# Patient Record
Sex: Female | Born: 1965 | Race: Black or African American | Hispanic: No | Marital: Single | Smoking: Never smoker
Health system: Southern US, Community
[De-identification: ages and names within clinical notes are randomized; demographics above are authoritative.]

## PROBLEM LIST (undated history)

## (undated) DIAGNOSIS — E7800 Pure hypercholesterolemia, unspecified: Secondary | ICD-10-CM

## (undated) HISTORY — PX: KNEE SURGERY: SHX244

---

## 2002-08-18 ENCOUNTER — Inpatient Hospital Stay (HOSPITAL_COMMUNITY): Admission: AC | Admit: 2002-08-18 | Discharge: 2002-09-02 | Payer: Self-pay

## 2002-08-18 ENCOUNTER — Encounter: Payer: Self-pay | Admitting: Orthopedic Surgery

## 2002-08-18 ENCOUNTER — Encounter: Payer: Self-pay | Admitting: Emergency Medicine

## 2002-08-30 ENCOUNTER — Encounter: Payer: Self-pay | Admitting: Orthopedic Surgery

## 2003-03-02 ENCOUNTER — Inpatient Hospital Stay (HOSPITAL_COMMUNITY): Admission: RE | Admit: 2003-03-02 | Discharge: 2003-03-04 | Payer: Self-pay | Admitting: Orthopedic Surgery

## 2003-03-02 ENCOUNTER — Encounter: Payer: Self-pay | Admitting: Orthopedic Surgery

## 2004-06-26 ENCOUNTER — Emergency Department (HOSPITAL_COMMUNITY): Admission: EM | Admit: 2004-06-26 | Discharge: 2004-06-26 | Payer: Self-pay | Admitting: Emergency Medicine

## 2011-04-27 ENCOUNTER — Other Ambulatory Visit (HOSPITAL_COMMUNITY)
Admission: RE | Admit: 2011-04-27 | Discharge: 2011-04-27 | Disposition: A | Discharge status: Home / Self Care | Payer: BC Managed Care – PPO | Source: Ambulatory Visit | Attending: Family Medicine | Admitting: Family Medicine

## 2011-04-27 DIAGNOSIS — Z0000 Encounter for general adult medical examination without abnormal findings: Secondary | ICD-10-CM | POA: Insufficient documentation

## 2011-04-27 DIAGNOSIS — Z113 Encounter for screening for infections with a predominantly sexual mode of transmission: Secondary | ICD-10-CM | POA: Insufficient documentation

## 2012-07-01 ENCOUNTER — Other Ambulatory Visit: Payer: Self-pay | Admitting: Physician Assistant

## 2012-07-01 DIAGNOSIS — Z1231 Encounter for screening mammogram for malignant neoplasm of breast: Secondary | ICD-10-CM

## 2012-07-15 ENCOUNTER — Other Ambulatory Visit (HOSPITAL_COMMUNITY)
Admission: RE | Admit: 2012-07-15 | Discharge: 2012-07-15 | Disposition: A | Discharge status: Home / Self Care | Payer: BC Managed Care – PPO | Source: Ambulatory Visit | Attending: Obstetrics and Gynecology | Admitting: Obstetrics and Gynecology

## 2012-07-15 DIAGNOSIS — Z1151 Encounter for screening for human papillomavirus (HPV): Secondary | ICD-10-CM | POA: Insufficient documentation

## 2012-07-15 DIAGNOSIS — Z01419 Encounter for gynecological examination (general) (routine) without abnormal findings: Secondary | ICD-10-CM | POA: Insufficient documentation

## 2012-07-16 ENCOUNTER — Ambulatory Visit
Admission: RE | Admit: 2012-07-16 | Discharge: 2012-07-16 | Disposition: A | Discharge status: Home / Self Care | Payer: BC Managed Care – PPO | Source: Ambulatory Visit | Attending: Physician Assistant | Admitting: Physician Assistant

## 2012-07-16 DIAGNOSIS — Z1231 Encounter for screening mammogram for malignant neoplasm of breast: Secondary | ICD-10-CM

## 2013-02-16 ENCOUNTER — Emergency Department (HOSPITAL_COMMUNITY)
Admission: EM | Admit: 2013-02-16 | Discharge: 2013-02-16 | Disposition: A | Discharge status: Home / Self Care | Payer: BC Managed Care – PPO | Attending: Emergency Medicine | Admitting: Emergency Medicine

## 2013-02-16 ENCOUNTER — Emergency Department (HOSPITAL_COMMUNITY): Payer: BC Managed Care – PPO

## 2013-02-16 ENCOUNTER — Encounter (HOSPITAL_COMMUNITY): Payer: Self-pay | Admitting: *Deleted

## 2013-02-16 DIAGNOSIS — IMO0002 Reserved for concepts with insufficient information to code with codable children: Secondary | ICD-10-CM | POA: Insufficient documentation

## 2013-02-16 DIAGNOSIS — S29019A Strain of muscle and tendon of unspecified wall of thorax, initial encounter: Secondary | ICD-10-CM

## 2013-02-16 DIAGNOSIS — S239XXA Sprain of unspecified parts of thorax, initial encounter: Secondary | ICD-10-CM | POA: Insufficient documentation

## 2013-02-16 DIAGNOSIS — Z79899 Other long term (current) drug therapy: Secondary | ICD-10-CM | POA: Insufficient documentation

## 2013-02-16 DIAGNOSIS — Y9389 Activity, other specified: Secondary | ICD-10-CM | POA: Insufficient documentation

## 2013-02-16 DIAGNOSIS — E7800 Pure hypercholesterolemia, unspecified: Secondary | ICD-10-CM | POA: Insufficient documentation

## 2013-02-16 DIAGNOSIS — S139XXA Sprain of joints and ligaments of unspecified parts of neck, initial encounter: Secondary | ICD-10-CM | POA: Insufficient documentation

## 2013-02-16 DIAGNOSIS — S161XXA Strain of muscle, fascia and tendon at neck level, initial encounter: Secondary | ICD-10-CM

## 2013-02-16 DIAGNOSIS — Y92410 Unspecified street and highway as the place of occurrence of the external cause: Secondary | ICD-10-CM | POA: Insufficient documentation

## 2013-02-16 HISTORY — DX: Pure hypercholesterolemia, unspecified: E78.00

## 2013-02-16 MED ORDER — CYCLOBENZAPRINE HCL 5 MG PO TABS: 5.0000 mg | ORAL_TABLET | Freq: Three times a day (TID) | ORAL | Status: DC | PRN

## 2013-02-16 MED ORDER — NAPROXEN 500 MG PO TABS: 500.0000 mg | ORAL_TABLET | Freq: Two times a day (BID) | ORAL | Status: DC
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
## 2013-04-05 ENCOUNTER — Encounter (HOSPITAL_COMMUNITY): Payer: Self-pay | Admitting: Emergency Medicine

## 2013-04-05 ENCOUNTER — Emergency Department (HOSPITAL_COMMUNITY)
Admission: EM | Admit: 2013-04-05 | Discharge: 2013-04-06 | Disposition: A | Discharge status: Home / Self Care | Payer: BC Managed Care – PPO | Attending: Emergency Medicine | Admitting: Emergency Medicine

## 2013-04-05 DIAGNOSIS — S0003XA Contusion of scalp, initial encounter: Secondary | ICD-10-CM | POA: Insufficient documentation

## 2013-04-05 DIAGNOSIS — S0093XA Contusion of unspecified part of head, initial encounter: Secondary | ICD-10-CM

## 2013-04-05 DIAGNOSIS — S20411A Abrasion of right back wall of thorax, initial encounter: Secondary | ICD-10-CM

## 2013-04-05 DIAGNOSIS — Y9389 Activity, other specified: Secondary | ICD-10-CM | POA: Insufficient documentation

## 2013-04-05 DIAGNOSIS — S0083XA Contusion of other part of head, initial encounter: Secondary | ICD-10-CM | POA: Insufficient documentation

## 2013-04-05 DIAGNOSIS — W1809XA Striking against other object with subsequent fall, initial encounter: Secondary | ICD-10-CM | POA: Insufficient documentation

## 2013-04-05 DIAGNOSIS — Y92410 Unspecified street and highway as the place of occurrence of the external cause: Secondary | ICD-10-CM | POA: Insufficient documentation

## 2013-04-05 DIAGNOSIS — S40811A Abrasion of right upper arm, initial encounter: Secondary | ICD-10-CM

## 2013-04-05 DIAGNOSIS — W010XXA Fall on same level from slipping, tripping and stumbling without subsequent striking against object, initial encounter: Secondary | ICD-10-CM | POA: Insufficient documentation

## 2013-04-05 DIAGNOSIS — E7800 Pure hypercholesterolemia, unspecified: Secondary | ICD-10-CM | POA: Insufficient documentation

## 2013-04-05 DIAGNOSIS — IMO0002 Reserved for concepts with insufficient information to code with codable children: Secondary | ICD-10-CM | POA: Insufficient documentation

## 2013-04-05 NOTE — ED Notes (Signed)
PT. REPORTS TRIPPED AND FELL WHILE PLAYING AROUND WITH KIDS TODAY , NO LOC /AMBULATORY , REPORTS SLIGHT HEADACHE .
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
## 2013-04-06 NOTE — ED Notes (Signed)
Pt refused to allow RN to take BP at discharge.

## 2013-04-06 NOTE — ED Notes (Signed)
Pupils reactive and equal.

## 2013-08-27 ENCOUNTER — Other Ambulatory Visit: Payer: Self-pay

## 2013-08-27 DIAGNOSIS — Z1231 Encounter for screening mammogram for malignant neoplasm of breast: Secondary | ICD-10-CM

## 2013-09-03 ENCOUNTER — Ambulatory Visit
Admission: RE | Admit: 2013-09-03 | Discharge: 2013-09-03 | Disposition: A | Discharge status: Home / Self Care | Payer: BC Managed Care – PPO | Source: Ambulatory Visit

## 2013-09-03 DIAGNOSIS — Z1231 Encounter for screening mammogram for malignant neoplasm of breast: Secondary | ICD-10-CM

## 2014-08-03 ENCOUNTER — Other Ambulatory Visit: Payer: Self-pay

## 2014-08-03 DIAGNOSIS — Z1231 Encounter for screening mammogram for malignant neoplasm of breast: Secondary | ICD-10-CM

## 2014-09-06 ENCOUNTER — Other Ambulatory Visit: Payer: Self-pay | Admitting: Family Medicine

## 2014-09-06 ENCOUNTER — Other Ambulatory Visit (HOSPITAL_COMMUNITY)
Admission: RE | Admit: 2014-09-06 | Discharge: 2014-09-06 | Disposition: A | Discharge status: Home / Self Care | Payer: BC Managed Care – PPO | Source: Ambulatory Visit | Attending: Family Medicine | Admitting: Family Medicine

## 2014-09-06 DIAGNOSIS — Z01419 Encounter for gynecological examination (general) (routine) without abnormal findings: Secondary | ICD-10-CM | POA: Diagnosis not present

## 2014-09-07 LAB — CYTOLOGY - PAP

## 2014-09-16 ENCOUNTER — Ambulatory Visit
Admission: RE | Admit: 2014-09-16 | Discharge: 2014-09-16 | Disposition: A | Discharge status: Home / Self Care | Payer: BC Managed Care – PPO | Source: Ambulatory Visit

## 2014-09-16 DIAGNOSIS — Z1231 Encounter for screening mammogram for malignant neoplasm of breast: Secondary | ICD-10-CM

## 2014-09-17 ENCOUNTER — Other Ambulatory Visit: Payer: Self-pay | Admitting: Family Medicine

## 2014-09-17 DIAGNOSIS — R928 Other abnormal and inconclusive findings on diagnostic imaging of breast: Secondary | ICD-10-CM

## 2014-09-29 ENCOUNTER — Ambulatory Visit
Admission: RE | Admit: 2014-09-29 | Discharge: 2014-09-29 | Disposition: A | Discharge status: Home / Self Care | Payer: BC Managed Care – PPO | Source: Ambulatory Visit | Attending: Family Medicine | Admitting: Family Medicine

## 2014-09-29 DIAGNOSIS — R928 Other abnormal and inconclusive findings on diagnostic imaging of breast: Secondary | ICD-10-CM

## 2015-09-08 ENCOUNTER — Other Ambulatory Visit: Payer: Self-pay

## 2015-09-08 DIAGNOSIS — Z1231 Encounter for screening mammogram for malignant neoplasm of breast: Secondary | ICD-10-CM

## 2015-10-03 ENCOUNTER — Ambulatory Visit
Admission: RE | Admit: 2015-10-03 | Discharge: 2015-10-03 | Disposition: A | Discharge status: Home / Self Care | Payer: BLUE CROSS/BLUE SHIELD | Source: Ambulatory Visit

## 2015-10-03 DIAGNOSIS — Z1231 Encounter for screening mammogram for malignant neoplasm of breast: Secondary | ICD-10-CM

## 2016-04-30 DIAGNOSIS — M25562 Pain in left knee: Secondary | ICD-10-CM | POA: Diagnosis not present

## 2016-09-19 ENCOUNTER — Other Ambulatory Visit: Payer: Self-pay | Admitting: Family Medicine

## 2016-09-19 DIAGNOSIS — Z1231 Encounter for screening mammogram for malignant neoplasm of breast: Secondary | ICD-10-CM

## 2016-10-04 ENCOUNTER — Ambulatory Visit: Payer: BLUE CROSS/BLUE SHIELD

## 2016-10-08 ENCOUNTER — Ambulatory Visit
Admission: RE | Admit: 2016-10-08 | Discharge: 2016-10-08 | Disposition: A | Discharge status: Home / Self Care | Payer: BLUE CROSS/BLUE SHIELD | Source: Ambulatory Visit | Attending: Family Medicine | Admitting: Family Medicine

## 2016-10-08 DIAGNOSIS — Z1231 Encounter for screening mammogram for malignant neoplasm of breast: Secondary | ICD-10-CM

## 2017-02-08 ENCOUNTER — Other Ambulatory Visit: Payer: Self-pay | Admitting: Family Medicine

## 2017-02-08 ENCOUNTER — Other Ambulatory Visit (HOSPITAL_COMMUNITY)
Admission: RE | Admit: 2017-02-08 | Discharge: 2017-02-08 | Disposition: A | Discharge status: Home / Self Care | Payer: BLUE CROSS/BLUE SHIELD | Source: Ambulatory Visit | Attending: Family Medicine | Admitting: Family Medicine

## 2017-02-08 DIAGNOSIS — Z0000 Encounter for general adult medical examination without abnormal findings: Secondary | ICD-10-CM | POA: Diagnosis not present

## 2017-02-08 DIAGNOSIS — Z1151 Encounter for screening for human papillomavirus (HPV): Secondary | ICD-10-CM | POA: Insufficient documentation

## 2017-02-08 DIAGNOSIS — E782 Mixed hyperlipidemia: Secondary | ICD-10-CM | POA: Diagnosis not present

## 2017-02-08 DIAGNOSIS — Z124 Encounter for screening for malignant neoplasm of cervix: Secondary | ICD-10-CM | POA: Diagnosis not present

## 2017-02-11 LAB — CYTOLOGY - PAP
DIAGNOSIS: NEGATIVE
HPV (WINDOPATH): NOT DETECTED

## 2017-03-22 DIAGNOSIS — E782 Mixed hyperlipidemia: Secondary | ICD-10-CM | POA: Diagnosis not present

## 2017-06-27 DIAGNOSIS — M79604 Pain in right leg: Secondary | ICD-10-CM | POA: Diagnosis not present

## 2017-06-27 DIAGNOSIS — S7011XA Contusion of right thigh, initial encounter: Secondary | ICD-10-CM | POA: Diagnosis not present

## 2017-06-27 DIAGNOSIS — M79651 Pain in right thigh: Secondary | ICD-10-CM | POA: Diagnosis not present

## 2017-07-22 DIAGNOSIS — M79651 Pain in right thigh: Secondary | ICD-10-CM | POA: Diagnosis not present

## 2017-10-29 ENCOUNTER — Other Ambulatory Visit: Payer: Self-pay | Admitting: Family Medicine

## 2017-10-29 DIAGNOSIS — Z1231 Encounter for screening mammogram for malignant neoplasm of breast: Secondary | ICD-10-CM

## 2017-11-21 ENCOUNTER — Ambulatory Visit
Admission: RE | Admit: 2017-11-21 | Discharge: 2017-11-21 | Disposition: A | Discharge status: Home / Self Care | Payer: BLUE CROSS/BLUE SHIELD | Source: Ambulatory Visit | Attending: Family Medicine | Admitting: Family Medicine

## 2017-11-21 DIAGNOSIS — Z1231 Encounter for screening mammogram for malignant neoplasm of breast: Secondary | ICD-10-CM | POA: Diagnosis not present

## 2017-11-26 ENCOUNTER — Ambulatory Visit: Payer: BLUE CROSS/BLUE SHIELD

## 2018-06-29 ENCOUNTER — Other Ambulatory Visit: Payer: Self-pay

## 2018-06-29 ENCOUNTER — Emergency Department (HOSPITAL_COMMUNITY)
Admission: EM | Admit: 2018-06-29 | Discharge: 2018-06-29 | Disposition: A | Discharge status: Home / Self Care | Payer: BLUE CROSS/BLUE SHIELD | Attending: Emergency Medicine | Admitting: Emergency Medicine

## 2018-06-29 ENCOUNTER — Encounter (HOSPITAL_COMMUNITY): Payer: Self-pay | Admitting: Emergency Medicine

## 2018-06-29 DIAGNOSIS — M5441 Lumbago with sciatica, right side: Secondary | ICD-10-CM | POA: Diagnosis not present

## 2018-06-29 DIAGNOSIS — M5431 Sciatica, right side: Secondary | ICD-10-CM | POA: Diagnosis not present

## 2018-06-29 DIAGNOSIS — M545 Low back pain: Secondary | ICD-10-CM | POA: Diagnosis not present

## 2018-06-29 MED ORDER — OXYCODONE-ACETAMINOPHEN 5-325 MG PO TABS: 1.0000 | ORAL_TABLET | ORAL | 0 refills | Status: AC | PRN

## 2018-06-29 MED ORDER — OXYCODONE-ACETAMINOPHEN 5-325 MG PO TABS
2.0000 | ORAL_TABLET | Freq: Once | ORAL | Status: AC
  Administered 2018-06-29: 2 via ORAL
  Filled 2018-06-29: qty 2

## 2018-06-29 MED ORDER — METHOCARBAMOL 750 MG PO TABS: 750.0000 mg | ORAL_TABLET | Freq: Four times a day (QID) | ORAL | 0 refills | Status: AC

## 2018-06-29 MED ORDER — DIAZEPAM 5 MG PO TABS
5.0000 mg | ORAL_TABLET | Freq: Once | ORAL | Status: AC
  Administered 2018-06-29: 5 mg via ORAL
  Filled 2018-06-29: qty 1

## 2018-06-29 MED ORDER — KETOROLAC TROMETHAMINE 60 MG/2ML IM SOLN
60.0000 mg | Freq: Once | INTRAMUSCULAR | Status: AC
  Administered 2018-06-29: 60 mg via INTRAMUSCULAR
  Filled 2018-06-29: qty 2

## 2018-06-29 NOTE — ED Provider Notes (Signed)
[facility]   CSN: [id number] Arrival date & time: 06/29/18  0746     History   Chief Complaint Chief Complaint  Patient presents with  . Sciatica    HPI Karen Jefferson is a 52 y.o. female.  52 year old female presents with acute onset of right lower back pain x24 hours.  Pain is sharp and worse with any movement and does radiate down to her right mid thigh.  No bowel or bladder dysfunction.  Better with remaining still.  History of similar symptoms in the past but no diagnosis was made.  Denies any rashes.  No urinary symptoms.  No treatment used prior to arrival.     Past Medical History:  Diagnosis Date  . Hypercholesterolemia     There are no active problems to display for this patient.   Past Surgical History:  Procedure Laterality Date  . KNEE SURGERY       OB History   None      Home Medications    Prior to Admission medications   Medication Sig Start Date End Date Taking? Authorizing Provider  ibuprofen (ADVIL,MOTRIN) 200 MG tablet Take 200 mg by mouth every 6 (six) hours as needed for pain.    [provider]    Family History No family history on file.  Social History Social History   Tobacco Use  . Smoking status: Never Smoker  Substance Use Topics  . Alcohol use: Yes  . Drug use: No     Allergies   Patient has no known allergies.   Review of Systems Review of Systems  All other systems reviewed and are negative.    Physical Exam Updated Vital Signs BP (!) 147/85 (BP Location: Right Arm)   Pulse 62   Temp 98.8 F (37.1 C) (Oral)   Resp 16   SpO2 100%   Physical Exam  Constitutional: She is oriented to person, place, and time. She appears well-developed and well-nourished.  Non-toxic appearance. No distress.  HENT:  Head: Normocephalic and atraumatic.  Eyes: Pupils are equal, round, and reactive to light. Conjunctivae, EOM and lids are normal.  Neck: Normal  range of motion. Neck supple. No tracheal deviation present. No thyroid mass present.  Cardiovascular: Normal rate, regular rhythm and normal heart sounds. Exam reveals no gallop.  No murmur heard. Pulmonary/Chest: Effort normal and breath sounds normal. No stridor. No respiratory distress. She has no decreased breath sounds. She has no wheezes. She has no rhonchi. She has no rales.  Abdominal: Soft. Normal appearance and bowel sounds are normal. She exhibits no distension. There is no tenderness. There is no rebound and no CVA tenderness.  Musculoskeletal: Normal range of motion. She exhibits no edema or tenderness.       Back:  Neurological: She is alert and oriented to person, place, and time. She has normal strength. No cranial nerve deficit or sensory deficit. GCS eye subscore is 4. GCS verbal subscore is 5. GCS motor subscore is 6.  Bilateral lower extremity strength 5/5.  Skin: Skin is warm and dry. No abrasion and no rash noted.  Psychiatric: She has a normal mood and affect. Her speech is normal and behavior is normal.  Nursing note and vitals reviewed.    ED Treatments / Results  Labs (all labs ordered are listed, but only abnormal results are displayed) Labs Reviewed - No data to display  EKG None  Radiology No results found.  Procedures Procedures (including critical  care time)  Medications Ordered in ED Medications  ketorolac (TORADOL) injection 60 mg (has no administration in time range)     Initial Impression / Assessment and Plan / ED Course  I have reviewed the triage vital signs and the nursing notes.  Pertinent labs & imaging results that were available during my care of the patient were reviewed by me and considered in my medical decision making (see chart for details).     Patient medicated for pain here and feels better.  Suspect sciatica.  No focal neurological deficits.  Stable for discharge  Final Clinical Impressions(s) / ED Diagnoses   Final  diagnoses:  None    ED Discharge Orders    None       Lorre Nick, MD 06/29/18 1039

## 2018-06-29 NOTE — ED Triage Notes (Signed)
Pt reports pain in the right side of her back that radiates down her leg, states she bent over yesterday and felt pain afterwards, reports walking increases pain.

## 2018-06-29 NOTE — ED Notes (Signed)
Patient states ride is here and she needs discharge papers. MD notified.

## 2018-07-04 DIAGNOSIS — M5431 Sciatica, right side: Secondary | ICD-10-CM | POA: Diagnosis not present

## 2018-07-09 ENCOUNTER — Ambulatory Visit
Admission: RE | Admit: 2018-07-09 | Discharge: 2018-07-09 | Disposition: A | Discharge status: Home / Self Care | Payer: BLUE CROSS/BLUE SHIELD | Source: Ambulatory Visit | Attending: Family Medicine | Admitting: Family Medicine

## 2018-07-09 ENCOUNTER — Other Ambulatory Visit: Payer: Self-pay | Admitting: Family Medicine

## 2018-07-09 DIAGNOSIS — M5431 Sciatica, right side: Secondary | ICD-10-CM

## 2018-07-09 DIAGNOSIS — M47816 Spondylosis without myelopathy or radiculopathy, lumbar region: Secondary | ICD-10-CM | POA: Diagnosis not present

## 2018-07-09 DIAGNOSIS — M48061 Spinal stenosis, lumbar region without neurogenic claudication: Secondary | ICD-10-CM | POA: Diagnosis not present

## 2018-08-22 DIAGNOSIS — J309 Allergic rhinitis, unspecified: Secondary | ICD-10-CM | POA: Diagnosis not present

## 2018-08-22 DIAGNOSIS — Z23 Encounter for immunization: Secondary | ICD-10-CM | POA: Diagnosis not present

## 2018-08-22 DIAGNOSIS — Z0000 Encounter for general adult medical examination without abnormal findings: Secondary | ICD-10-CM | POA: Diagnosis not present

## 2018-08-22 DIAGNOSIS — E782 Mixed hyperlipidemia: Secondary | ICD-10-CM | POA: Diagnosis not present

## 2018-12-03 ENCOUNTER — Other Ambulatory Visit: Payer: Self-pay | Admitting: Family Medicine

## 2018-12-03 DIAGNOSIS — Z1231 Encounter for screening mammogram for malignant neoplasm of breast: Secondary | ICD-10-CM

## 2019-01-14 ENCOUNTER — Other Ambulatory Visit: Payer: Self-pay | Admitting: Family Medicine

## 2019-01-14 DIAGNOSIS — Z1231 Encounter for screening mammogram for malignant neoplasm of breast: Secondary | ICD-10-CM

## 2019-02-11 ENCOUNTER — Ambulatory Visit
Admission: RE | Admit: 2019-02-11 | Discharge: 2019-02-11 | Disposition: A | Discharge status: Home / Self Care | Payer: BLUE CROSS/BLUE SHIELD | Source: Ambulatory Visit | Attending: Family Medicine | Admitting: Family Medicine

## 2019-02-11 DIAGNOSIS — Z1231 Encounter for screening mammogram for malignant neoplasm of breast: Secondary | ICD-10-CM

## 2019-05-25 DIAGNOSIS — R05 Cough: Secondary | ICD-10-CM | POA: Diagnosis not present

## 2019-05-25 DIAGNOSIS — J309 Allergic rhinitis, unspecified: Secondary | ICD-10-CM | POA: Diagnosis not present

## 2019-05-25 DIAGNOSIS — R630 Anorexia: Secondary | ICD-10-CM | POA: Diagnosis not present

## 2019-08-28 DIAGNOSIS — J309 Allergic rhinitis, unspecified: Secondary | ICD-10-CM | POA: Diagnosis not present

## 2019-08-28 DIAGNOSIS — Z0000 Encounter for general adult medical examination without abnormal findings: Secondary | ICD-10-CM | POA: Diagnosis not present

## 2019-08-28 DIAGNOSIS — E782 Mixed hyperlipidemia: Secondary | ICD-10-CM | POA: Diagnosis not present

## 2019-08-31 DIAGNOSIS — Z1211 Encounter for screening for malignant neoplasm of colon: Secondary | ICD-10-CM | POA: Diagnosis not present

## 2020-02-25 ENCOUNTER — Other Ambulatory Visit: Payer: Self-pay | Admitting: Family Medicine

## 2020-02-25 DIAGNOSIS — Z1231 Encounter for screening mammogram for malignant neoplasm of breast: Secondary | ICD-10-CM

## 2020-05-09 ENCOUNTER — Other Ambulatory Visit: Payer: Self-pay

## 2020-05-09 ENCOUNTER — Ambulatory Visit
Admission: RE | Admit: 2020-05-09 | Discharge: 2020-05-09 | Disposition: A | Discharge status: Home / Self Care | Payer: BC Managed Care – PPO | Source: Ambulatory Visit | Attending: Family Medicine | Admitting: Family Medicine

## 2020-05-09 ENCOUNTER — Ambulatory Visit: Payer: BLUE CROSS/BLUE SHIELD

## 2020-05-09 DIAGNOSIS — Z1231 Encounter for screening mammogram for malignant neoplasm of breast: Secondary | ICD-10-CM

## 2020-05-09 DIAGNOSIS — J309 Allergic rhinitis, unspecified: Secondary | ICD-10-CM | POA: Diagnosis not present

## 2020-05-09 DIAGNOSIS — R0683 Snoring: Secondary | ICD-10-CM | POA: Diagnosis not present

## 2020-05-09 DIAGNOSIS — M79671 Pain in right foot: Secondary | ICD-10-CM | POA: Diagnosis not present

## 2020-05-26 ENCOUNTER — Ambulatory Visit: Payer: Self-pay

## 2020-05-26 ENCOUNTER — Encounter: Payer: Self-pay | Admitting: Podiatry

## 2020-05-26 ENCOUNTER — Ambulatory Visit (INDEPENDENT_AMBULATORY_CARE_PROVIDER_SITE_OTHER): Payer: BC Managed Care – PPO | Admitting: Podiatry

## 2020-05-26 ENCOUNTER — Other Ambulatory Visit: Payer: Self-pay

## 2020-05-26 VITALS — BP 125/82 | HR 66

## 2020-05-26 DIAGNOSIS — Q742 Other congenital malformations of lower limb(s), including pelvic girdle: Secondary | ICD-10-CM

## 2020-05-26 DIAGNOSIS — Q828 Other specified congenital malformations of skin: Secondary | ICD-10-CM | POA: Diagnosis not present

## 2020-05-26 DIAGNOSIS — M2141 Flat foot [pes planus] (acquired), right foot: Secondary | ICD-10-CM

## 2020-05-26 DIAGNOSIS — M779 Enthesopathy, unspecified: Secondary | ICD-10-CM | POA: Diagnosis not present

## 2020-05-26 DIAGNOSIS — M2142 Flat foot [pes planus] (acquired), left foot: Secondary | ICD-10-CM

## 2020-05-26 NOTE — Patient Instructions (Signed)

## 2020-05-26 NOTE — Progress Notes (Signed)
Subjective:   Patient ID: Karen Jefferson, female   DOB: 54 y.o.   MRN: [id number]   HPI 54 year old female presents the office today for concerns of bilateral foot pain mostly on areas of calluses to both of the big toes as well as submetatarsal 5.  Is been ongoing for the last several months.  It is worse when she wears steel toe shoes at work.  She wears regular shoes her feet feel much better.  She denies recent injury or trauma.  No swelling.  No redness or warmth.   Review of Systems  All other systems reviewed and are negative.  Past Medical History:  Diagnosis Date  . Hypercholesterolemia     Past Surgical History:  Procedure Laterality Date  . KNEE SURGERY       Current Outpatient Medications:  .  atorvastatin (LIPITOR) 40 MG tablet, , Disp: , Rfl:  .  ibuprofen (ADVIL,MOTRIN) 200 MG tablet, Take 200 mg by mouth every 6 (six) hours as needed for pain., Disp: , Rfl:  .  methocarbamol (ROBAXIN-750) 750 MG tablet, Take 1 tablet (750 mg total) by mouth 4 (four) times daily., Disp: 20 tablet, Rfl: 0 .  oxyCODONE-acetaminophen (PERCOCET/ROXICET) 5-325 MG tablet, Take 1-2 tablets by mouth every 4 (four) hours as needed for severe pain., Disp: 15 tablet, Rfl: 0  No Known Allergies       Objective:  Physical Exam  General: AAO x3, NAD  Dermatological: Thick hyperkeratotic lesions plantar medial hallux as well as submetatarsal 5.  There is no ongoing ulceration, drainage or any sign of infection noted today.  No open lesions.  Dry, peeling skin plantar feet right side worse than left.  There is no erythema or itching.  Vascular: Dorsalis Pedis artery and Posterior Tibial artery pedal pulses are 2/4 bilateral with immedate capillary fill time. There is no pain with calf compression, swelling, warmth, erythema.   Neruologic: Grossly intact via light touch bilateral. Protective threshold with Semmes Wienstein monofilament intact to all pedal sites bilateral.   Musculoskeletal:  Flatfoot is present overpronation during gait. Muscular strength 5/5 in all groups tested bilateral.  Gait: Unassisted, Nonantalgic.       Assessment:   54 year old female with hyperkeratotic lesions due to biomechanical changes; dry skin    Plan:  -Treatment options discussed including all alternatives, risks, and complications -Etiology of symptoms were discussed -Debrided hyperkeratotic lesions x4 without any complications or bleeding.  She has tried over-the-counter inserts for any relief.  Discussed custom orthotics (arch support also to help take pressure off the hyperkeratotic lesions. She was seen today by Raiford Noble our pedorthitist for measurement of orthotics.  -A sample of miracle foot cream was dispensed today.  Vivi Barrack DPM

## 2020-06-13 DIAGNOSIS — R0683 Snoring: Secondary | ICD-10-CM | POA: Diagnosis not present

## 2020-06-16 ENCOUNTER — Ambulatory Visit: Payer: BC Managed Care – PPO | Admitting: Orthotics

## 2020-06-16 ENCOUNTER — Other Ambulatory Visit: Payer: Self-pay

## 2020-06-16 DIAGNOSIS — Q828 Other specified congenital malformations of skin: Secondary | ICD-10-CM

## 2020-06-16 NOTE — Progress Notes (Signed)
Rescanned as Richie never got scans; Dawn will call when they come in and overbook schedule if necessary

## 2020-07-05 ENCOUNTER — Other Ambulatory Visit: Payer: Self-pay

## 2020-07-05 ENCOUNTER — Ambulatory Visit (INDEPENDENT_AMBULATORY_CARE_PROVIDER_SITE_OTHER): Payer: BC Managed Care – PPO | Admitting: Orthotics

## 2020-07-05 DIAGNOSIS — M779 Enthesopathy, unspecified: Secondary | ICD-10-CM

## 2020-07-05 DIAGNOSIS — M2141 Flat foot [pes planus] (acquired), right foot: Secondary | ICD-10-CM

## 2020-07-05 DIAGNOSIS — Q828 Other specified congenital malformations of skin: Secondary | ICD-10-CM

## 2020-07-05 NOTE — Progress Notes (Signed)
Patient came in today to pick up custom made foot orthotics.  The goals were accomplished and the patient reported no dissatisfaction with said orthotics.  Patient was advised of breakin period and how to report any issues. 

## 2020-07-14 ENCOUNTER — Ambulatory Visit: Payer: BC Managed Care – PPO | Admitting: Podiatry

## 2020-09-07 DIAGNOSIS — E782 Mixed hyperlipidemia: Secondary | ICD-10-CM | POA: Diagnosis not present

## 2020-09-07 DIAGNOSIS — J309 Allergic rhinitis, unspecified: Secondary | ICD-10-CM | POA: Diagnosis not present

## 2020-09-07 DIAGNOSIS — Z0000 Encounter for general adult medical examination without abnormal findings: Secondary | ICD-10-CM | POA: Diagnosis not present

## 2020-11-27 DIAGNOSIS — Z1211 Encounter for screening for malignant neoplasm of colon: Secondary | ICD-10-CM | POA: Diagnosis not present

## 2021-09-06 ENCOUNTER — Other Ambulatory Visit: Payer: Self-pay | Admitting: Family Medicine

## 2021-09-06 DIAGNOSIS — Z1231 Encounter for screening mammogram for malignant neoplasm of breast: Secondary | ICD-10-CM

## 2021-10-06 ENCOUNTER — Ambulatory Visit
Admission: RE | Admit: 2021-10-06 | Discharge: 2021-10-06 | Disposition: A | Discharge status: Home / Self Care | Payer: BC Managed Care – PPO | Source: Ambulatory Visit | Attending: Family Medicine | Admitting: Family Medicine

## 2021-10-06 ENCOUNTER — Other Ambulatory Visit: Payer: Self-pay

## 2021-10-06 DIAGNOSIS — Z1231 Encounter for screening mammogram for malignant neoplasm of breast: Secondary | ICD-10-CM

## 2022-01-31 IMAGING — MG DIGITAL SCREENING BILAT W/ TOMO W/ CAD
8 series · 8 of 24 positions shown · non-contrast
Comparison: Previous exam(s).

CLINICAL DATA: Screening.

EXAM:
DIGITAL SCREENING BILATERAL MAMMOGRAM WITH TOMO AND CAD

[R CC synth-2D]
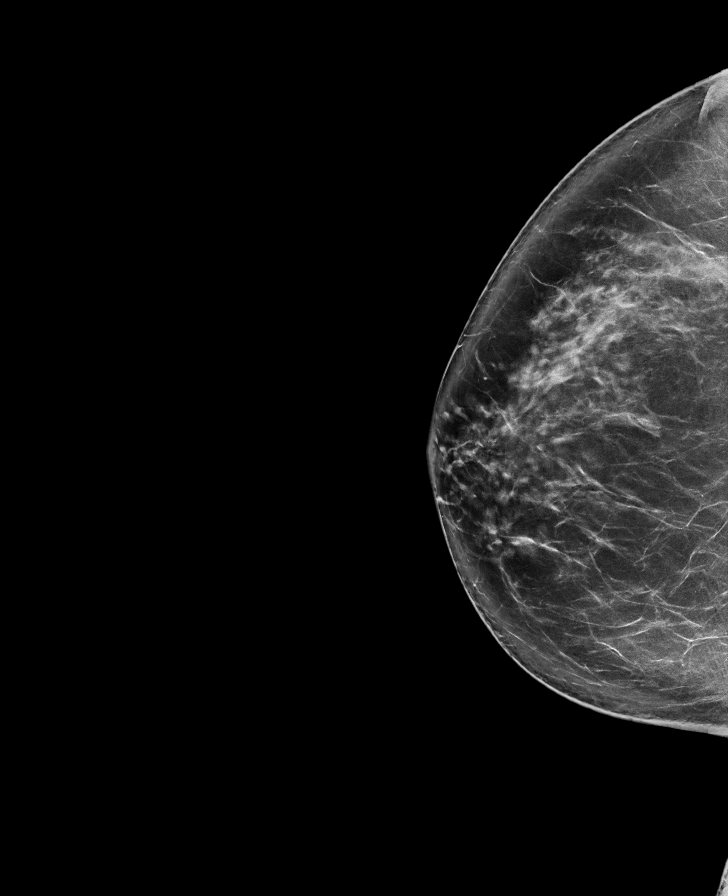

[L MLO synth-2D]
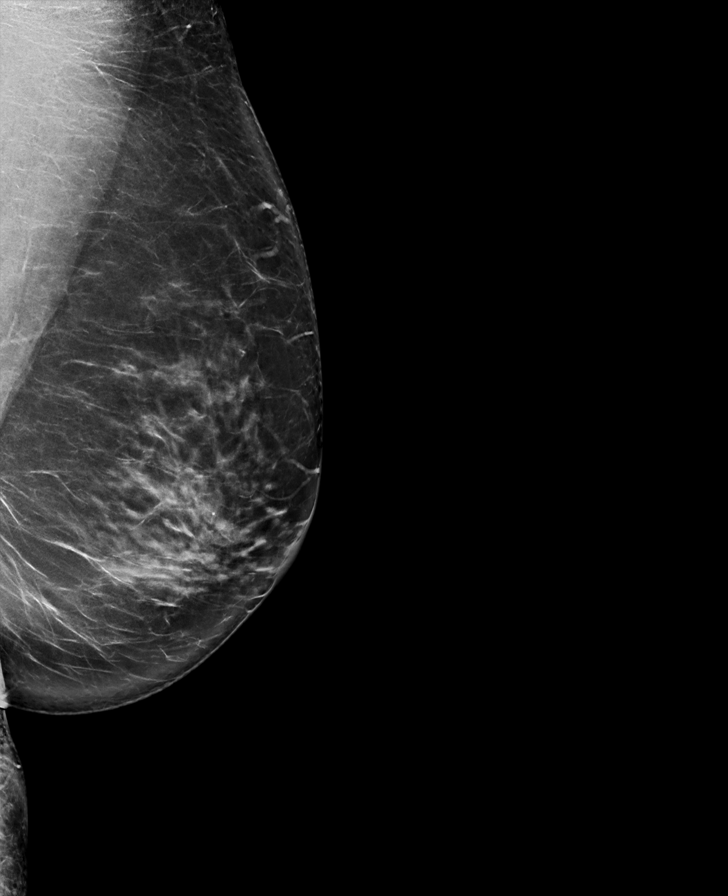

[R MLO synth-2D]
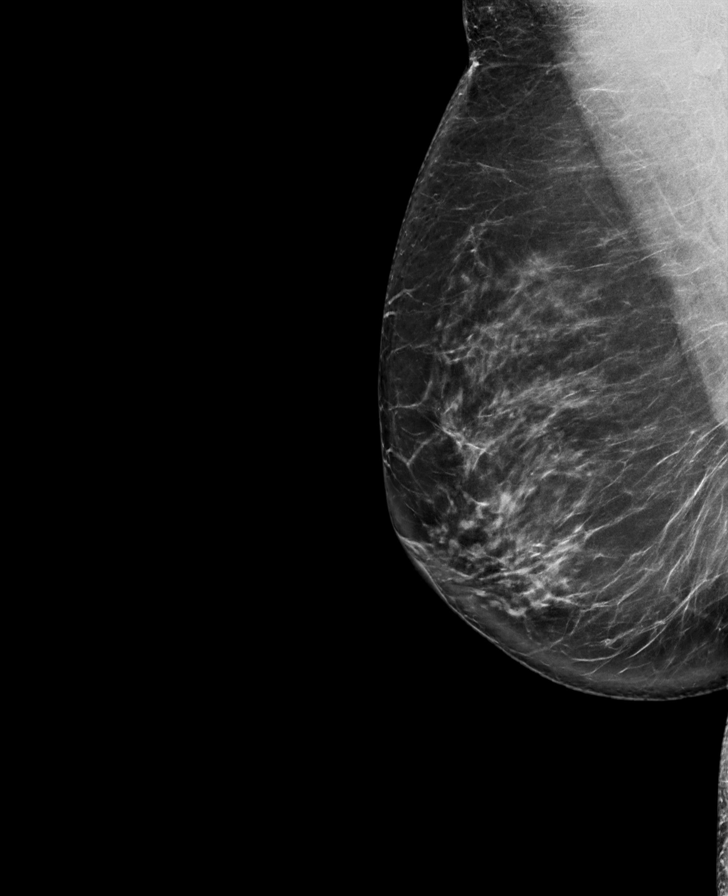

[L CC synth-2D]
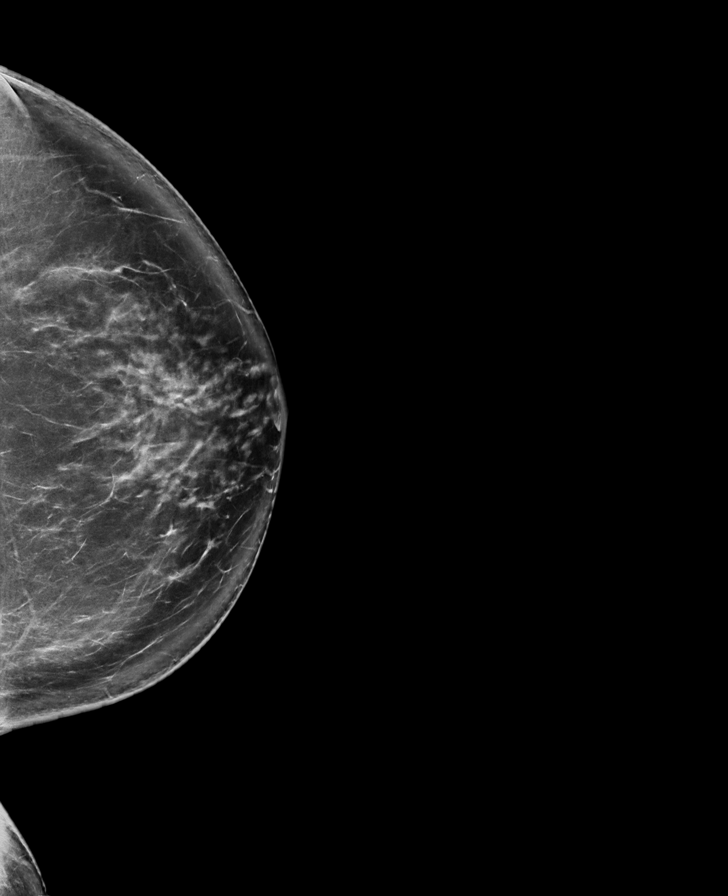

[L CC tomo · tomo slice 47/93.0]
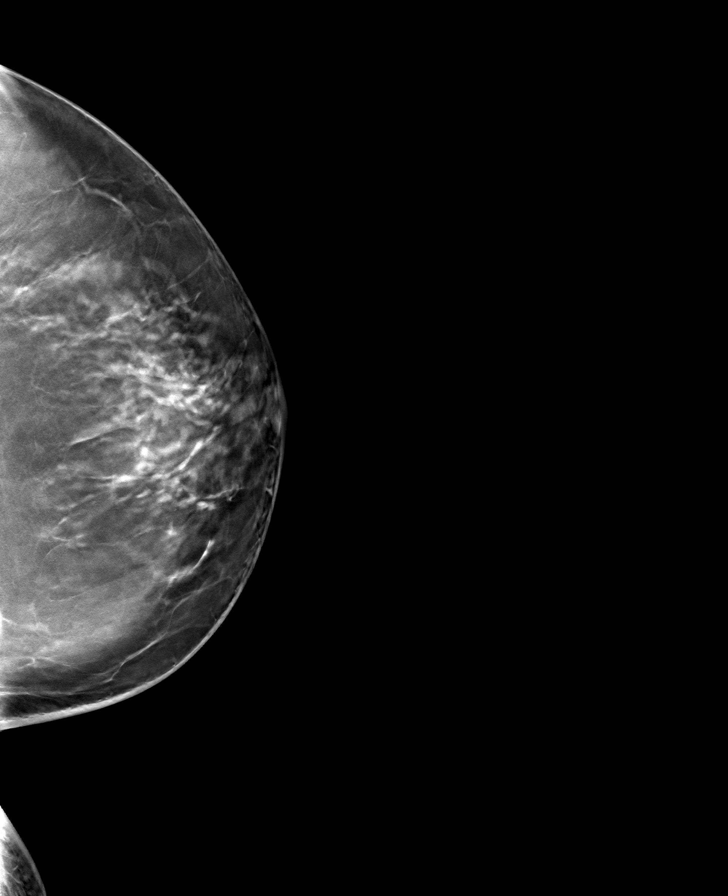

[R MLO tomo · tomo slice 47/94.0]
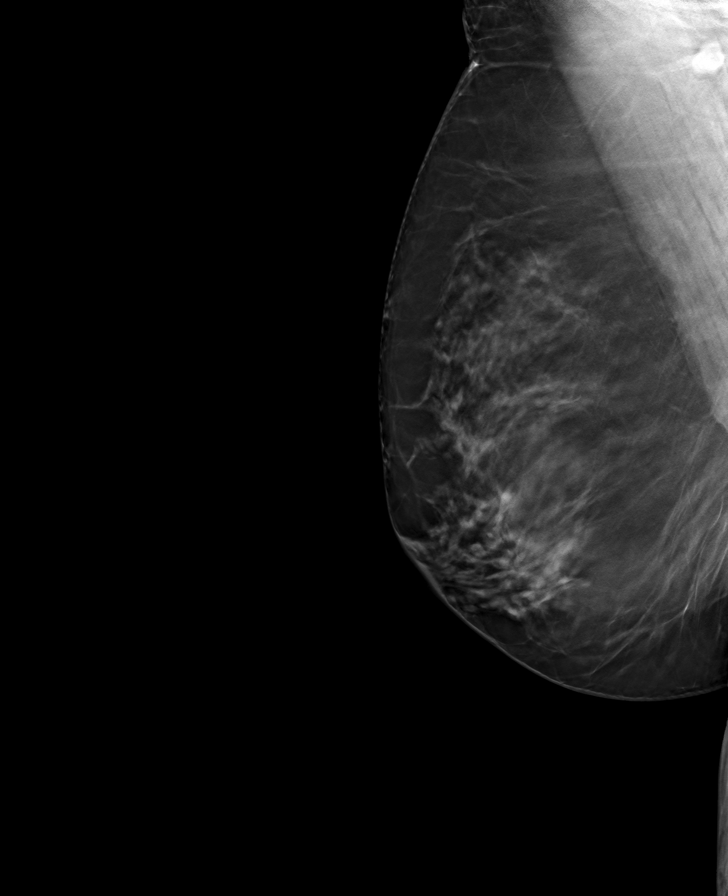

[R CC tomo · tomo slice 46/91.0]
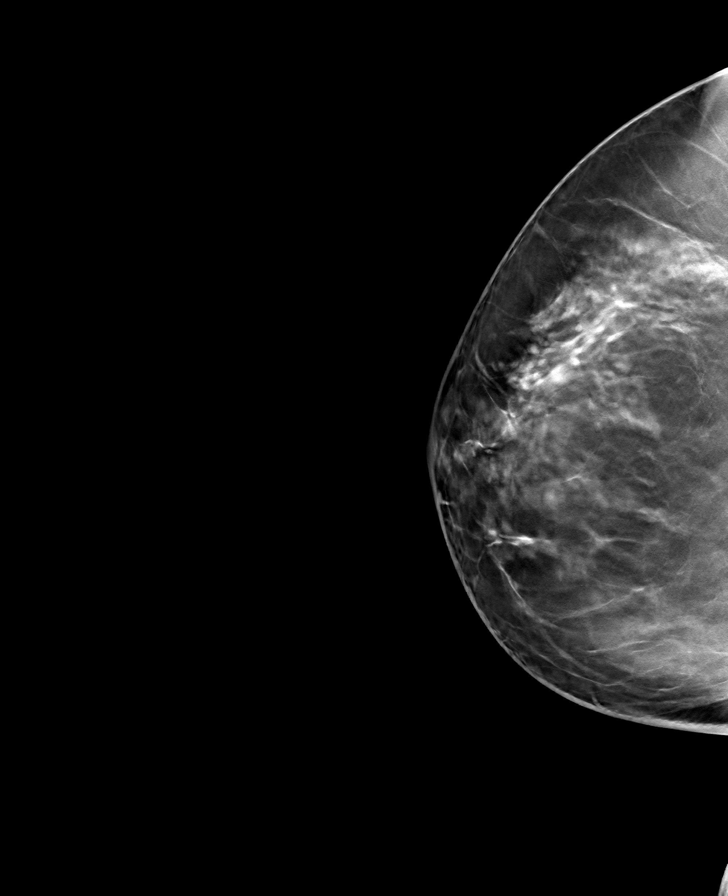

[L MLO tomo · tomo slice 47/92.0]
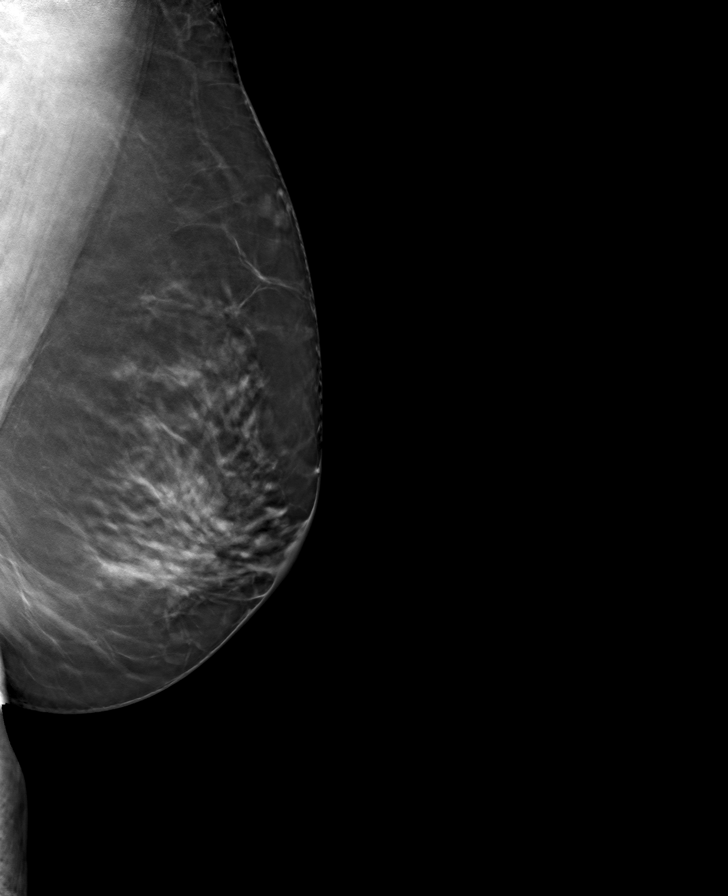

[8 of 24 positions shown; findings below may reference images not displayed]

ACR Breast Density Category c: The breast tissue is heterogeneously
dense, which may obscure small masses.
FINDINGS: There are no findings suspicious for malignancy. Images were
processed with CAD.
IMPRESSION: No mammographic evidence of malignancy. A result letter of this
screening mammogram will be mailed directly to the patient.

RECOMMENDATION:
Screening mammogram in one year. (Code:FT-U-LHB)

BI-RADS CATEGORY  1: Negative.

## 2023-09-26 ENCOUNTER — Other Ambulatory Visit: Payer: Self-pay | Admitting: Family Medicine

## 2023-09-26 DIAGNOSIS — Z1231 Encounter for screening mammogram for malignant neoplasm of breast: Secondary | ICD-10-CM

## 2023-09-27 ENCOUNTER — Ambulatory Visit
Admission: RE | Admit: 2023-09-27 | Discharge: 2023-09-27 | Disposition: A | Discharge status: Home / Self Care | Payer: BC Managed Care – PPO | Source: Ambulatory Visit | Attending: Family Medicine | Admitting: Family Medicine

## 2023-09-27 DIAGNOSIS — Z1231 Encounter for screening mammogram for malignant neoplasm of breast: Secondary | ICD-10-CM

## 2024-10-06 ENCOUNTER — Other Ambulatory Visit: Payer: Self-pay | Admitting: Family Medicine

## 2024-10-06 DIAGNOSIS — Z1231 Encounter for screening mammogram for malignant neoplasm of breast: Secondary | ICD-10-CM

## 2024-10-23 ENCOUNTER — Ambulatory Visit
Admission: RE | Admit: 2024-10-23 | Discharge: 2024-10-23 | Disposition: A | Discharge status: Home / Self Care | Source: Ambulatory Visit | Attending: Family Medicine | Admitting: Family Medicine

## 2024-10-23 DIAGNOSIS — Z1231 Encounter for screening mammogram for malignant neoplasm of breast: Secondary | ICD-10-CM
